# Patient Record
Sex: Female | Born: 1950 | Race: White | Hispanic: No | Marital: Married
Health system: Southern US, Community
[De-identification: ages and names within clinical notes are randomized; demographics above are authoritative.]

---

## 2004-03-10 ENCOUNTER — Ambulatory Visit: Payer: Self-pay | Admitting: Obstetrics and Gynecology

## 2005-05-03 ENCOUNTER — Ambulatory Visit: Payer: Self-pay | Admitting: Obstetrics and Gynecology

## 2005-05-21 ENCOUNTER — Ambulatory Visit: Payer: Self-pay | Admitting: Unknown Physician Specialty

## 2005-06-11 ENCOUNTER — Ambulatory Visit: Payer: Self-pay | Admitting: Unknown Physician Specialty

## 2005-10-14 ENCOUNTER — Ambulatory Visit: Payer: Self-pay | Admitting: Unknown Physician Specialty

## 2005-11-03 ENCOUNTER — Other Ambulatory Visit: Payer: Self-pay

## 2005-11-08 ENCOUNTER — Ambulatory Visit: Payer: Self-pay | Admitting: Unknown Physician Specialty

## 2005-12-16 ENCOUNTER — Ambulatory Visit: Payer: Self-pay | Admitting: Internal Medicine

## 2006-01-24 ENCOUNTER — Ambulatory Visit: Payer: Self-pay | Admitting: Internal Medicine

## 2006-01-29 ENCOUNTER — Ambulatory Visit: Payer: Self-pay | Admitting: Internal Medicine

## 2006-03-01 ENCOUNTER — Ambulatory Visit: Payer: Self-pay | Admitting: Internal Medicine

## 2006-04-01 ENCOUNTER — Ambulatory Visit: Payer: Self-pay | Admitting: Internal Medicine

## 2006-05-31 ENCOUNTER — Ambulatory Visit: Payer: Self-pay | Admitting: Internal Medicine

## 2006-06-01 ENCOUNTER — Ambulatory Visit: Payer: Self-pay | Admitting: Internal Medicine

## 2006-06-30 ENCOUNTER — Ambulatory Visit: Payer: Self-pay | Admitting: Internal Medicine

## 2007-02-10 ENCOUNTER — Inpatient Hospital Stay: Payer: Self-pay | Admitting: Internal Medicine

## 2007-05-08 ENCOUNTER — Ambulatory Visit: Payer: Self-pay | Admitting: Obstetrics and Gynecology

## 2008-10-29 ENCOUNTER — Ambulatory Visit: Payer: Self-pay

## 2010-10-12 ENCOUNTER — Ambulatory Visit: Payer: Self-pay | Admitting: Orthopedic Surgery

## 2010-10-22 ENCOUNTER — Inpatient Hospital Stay: Payer: Self-pay | Admitting: Orthopedic Surgery

## 2010-10-27 LAB — PATHOLOGY REPORT

## 2011-02-05 ENCOUNTER — Ambulatory Visit: Payer: Self-pay | Admitting: Orthopedic Surgery

## 2011-03-23 ENCOUNTER — Ambulatory Visit: Payer: Self-pay | Admitting: Orthopedic Surgery

## 2011-03-23 LAB — BASIC METABOLIC PANEL
Calcium, Total: 8.9 mg/dL (ref 8.5–10.1)
Co2: 27 mmol/L (ref 21–32)
Creatinine: 0.62 mg/dL (ref 0.60–1.30)
EGFR (African American): >60
Glucose: 81 mg/dL (ref 65–99)
Osmolality: 273 (ref 275–301)
Potassium: 3.7 mmol/L (ref 3.5–5.1)
Sodium: 138 mmol/L (ref 136–145)

## 2011-03-23 LAB — SEDIMENTATION RATE: Erythrocyte Sed Rate: 16 mm/hr (ref 0–30)

## 2011-03-23 LAB — APTT: Activated PTT: 34.4 secs (ref 23.6–35.9)

## 2011-03-23 LAB — CBC
MCH: 25.9 pg — ABNORMAL LOW (ref 26.0–34.0)
Platelet: 319 10*3/uL (ref 150–440)
RBC: 4.78 10*6/uL (ref 3.80–5.20)
WBC: 8.9 10*3/uL (ref 3.6–11.0)

## 2011-03-23 LAB — MRSA PCR SCREENING

## 2011-03-30 ENCOUNTER — Inpatient Hospital Stay: Payer: Self-pay | Admitting: Orthopedic Surgery

## 2011-03-31 LAB — PLATELET COUNT: Platelet: 231 10*3/uL (ref 150–440)

## 2011-03-31 LAB — BASIC METABOLIC PANEL
Anion Gap: 9 (ref 7–16)
BUN: 12 mg/dL (ref 7–18)
Calcium, Total: 7.7 mg/dL — ABNORMAL LOW (ref 8.5–10.1)
Chloride: 99 mmol/L (ref 98–107)
Co2: 28 mmol/L (ref 21–32)
Creatinine: 0.96 mg/dL (ref 0.60–1.30)
Potassium: 4.1 mmol/L (ref 3.5–5.1)

## 2011-04-01 LAB — HEMOGLOBIN: HGB: 9.4 g/dL — ABNORMAL LOW (ref 12.0–16.0)

## 2011-04-02 LAB — PATHOLOGY REPORT

## 2011-06-24 ENCOUNTER — Ambulatory Visit: Payer: Self-pay | Admitting: Internal Medicine

## 2011-07-07 ENCOUNTER — Ambulatory Visit: Payer: Self-pay | Admitting: Orthopedic Surgery

## 2011-07-15 ENCOUNTER — Ambulatory Visit: Payer: Self-pay | Admitting: Unknown Physician Specialty

## 2011-07-22 LAB — PATHOLOGY REPORT

## 2011-08-16 ENCOUNTER — Ambulatory Visit: Payer: Self-pay | Admitting: Orthopedic Surgery

## 2011-08-16 LAB — CBC
HCT: 37.6 % (ref 35.0–47.0)
MCH: 26.9 pg (ref 26.0–34.0)
MCV: 83 fL (ref 80–100)
Platelet: 279 10*3/uL (ref 150–440)
RDW: 14 % (ref 11.5–14.5)
WBC: 6.7 10*3/uL (ref 3.6–11.0)

## 2011-08-16 LAB — BASIC METABOLIC PANEL
Anion Gap: 10 (ref 7–16)
Calcium, Total: 8.6 mg/dL (ref 8.5–10.1)
Creatinine: 0.61 mg/dL (ref 0.60–1.30)
EGFR (Non-African Amer.): >60
Glucose: 90 mg/dL (ref 65–99)
Osmolality: 276 (ref 275–301)
Potassium: 3.7 mmol/L (ref 3.5–5.1)

## 2011-08-16 LAB — SEDIMENTATION RATE: Erythrocyte Sed Rate: 17 mm/hr (ref 0–30)

## 2011-08-26 ENCOUNTER — Inpatient Hospital Stay: Payer: Self-pay | Admitting: Orthopedic Surgery

## 2011-08-27 LAB — HEMOGLOBIN: HGB: 11.1 g/dL — ABNORMAL LOW (ref 12.0–16.0)

## 2011-08-27 LAB — BASIC METABOLIC PANEL
BUN: 13 mg/dL (ref 7–18)
Calcium, Total: 7.9 mg/dL — ABNORMAL LOW (ref 8.5–10.1)
Co2: 27 mmol/L (ref 21–32)
Creatinine: 0.71 mg/dL (ref 0.60–1.30)
EGFR (African American): >60
Glucose: 100 mg/dL — ABNORMAL HIGH (ref 65–99)
Sodium: 130 mmol/L — ABNORMAL LOW (ref 136–145)

## 2011-08-27 LAB — PLATELET COUNT: Platelet: 296 10*3/uL (ref 150–440)

## 2011-08-28 LAB — OSMOLALITY, URINE: Osmolality: 243 mOsm/kg

## 2011-08-28 LAB — HEMOGLOBIN: HGB: 10.8 g/dL — ABNORMAL LOW (ref 12.0–16.0)

## 2011-08-28 LAB — BASIC METABOLIC PANEL
Calcium, Total: 8 mg/dL — ABNORMAL LOW (ref 8.5–10.1)
Chloride: 93 mmol/L — ABNORMAL LOW (ref 98–107)
Creatinine: 0.5 mg/dL — ABNORMAL LOW (ref 0.60–1.30)
EGFR (African American): >60
Glucose: 102 mg/dL — ABNORMAL HIGH (ref 65–99)
Sodium: 128 mmol/L — ABNORMAL LOW (ref 136–145)

## 2011-08-28 LAB — SODIUM, URINE, RANDOM: Sodium, Urine Random: 54 mmol/L (ref 20–110)

## 2011-08-29 LAB — BASIC METABOLIC PANEL
Anion Gap: 8 (ref 7–16)
Co2: 29 mmol/L (ref 21–32)
Creatinine: 0.51 mg/dL — ABNORMAL LOW (ref 0.60–1.30)
EGFR (Non-African Amer.): >60
Glucose: 102 mg/dL — ABNORMAL HIGH (ref 65–99)
Potassium: 3.7 mmol/L (ref 3.5–5.1)
Sodium: 132 mmol/L — ABNORMAL LOW (ref 136–145)

## 2011-09-13 ENCOUNTER — Emergency Department: Payer: Self-pay | Admitting: Emergency Medicine

## 2012-07-15 IMAGING — CR DG CHEST 2V
1 series · 3 of 3 positions shown · non-contrast
Comparison: none

REASON FOR EXAM: htn
COMMENTS:

PROCEDURE:     DXR - DXR CHEST PA (OR AP) AND LATERAL  - October 12, 2010  [DATE]
RESULT:     Comparison: None.

[Series 1: view not recorded · 0.17mm/px · 3 of 3 slices shown]
[im 1/3]
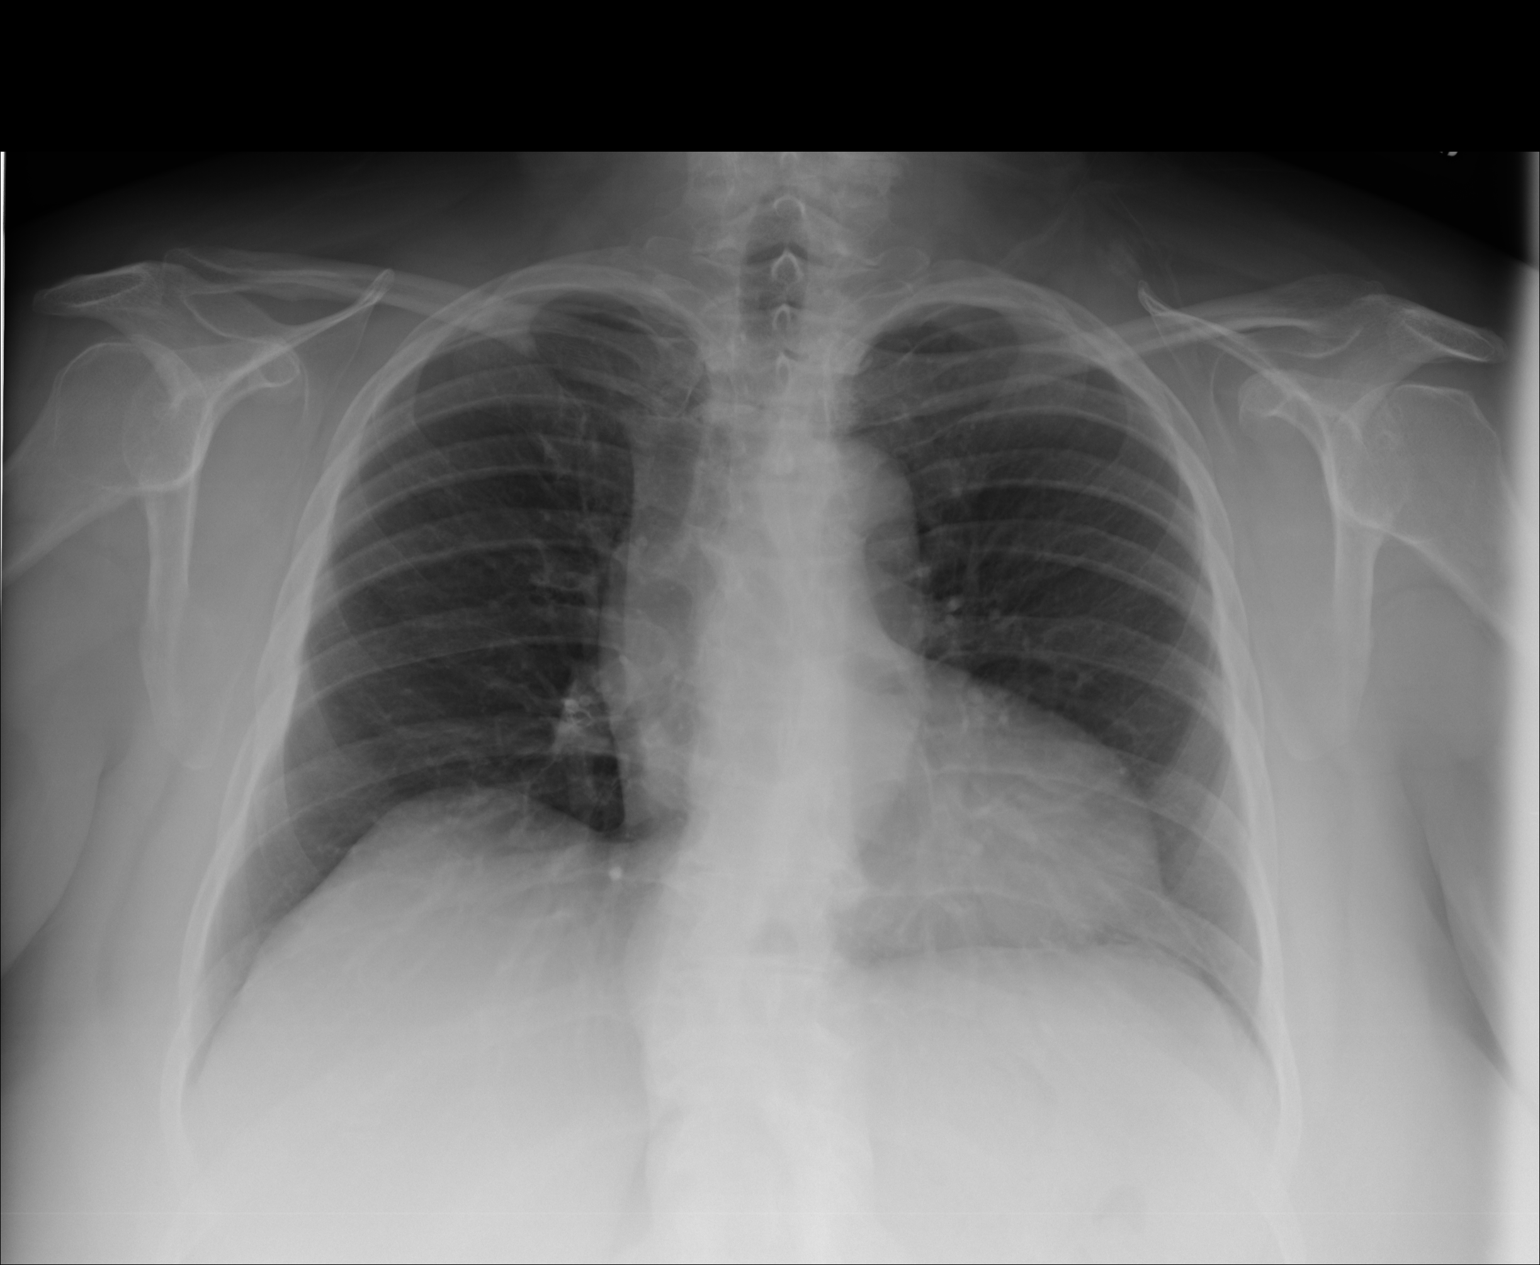
[im 2/3]
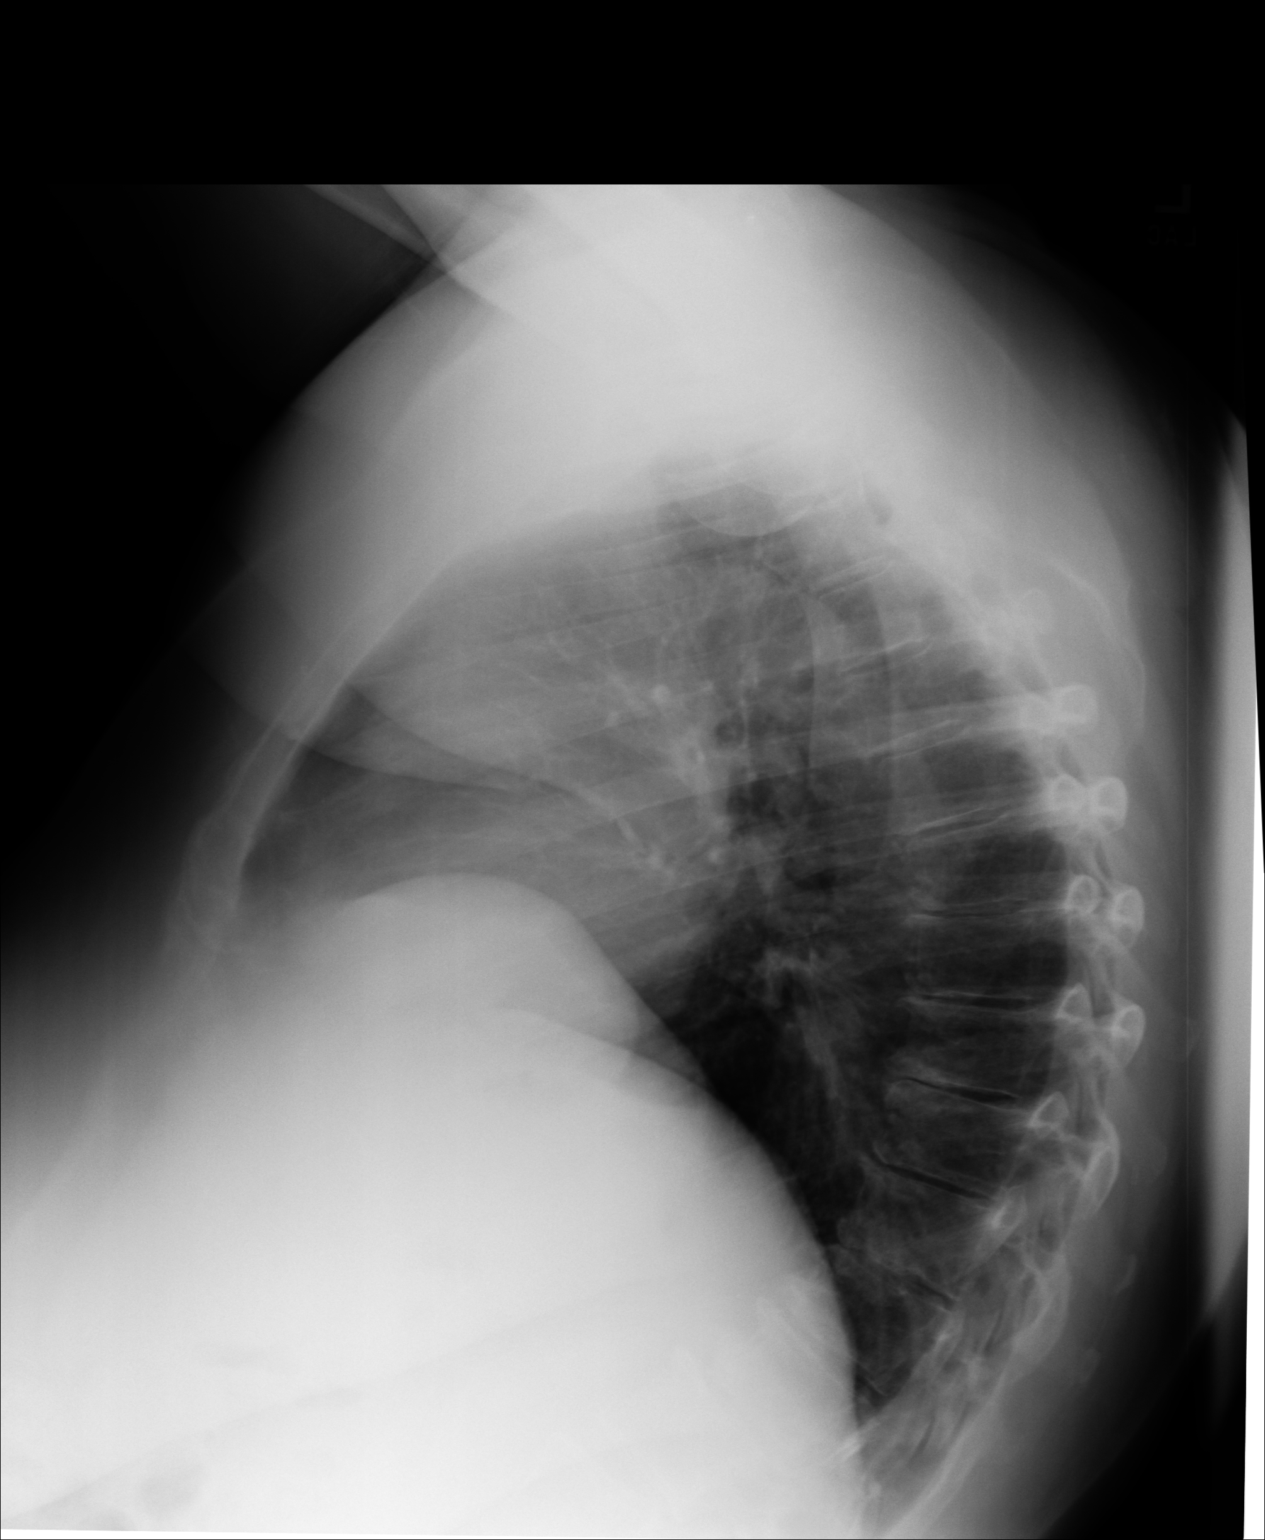
[im 3/3]
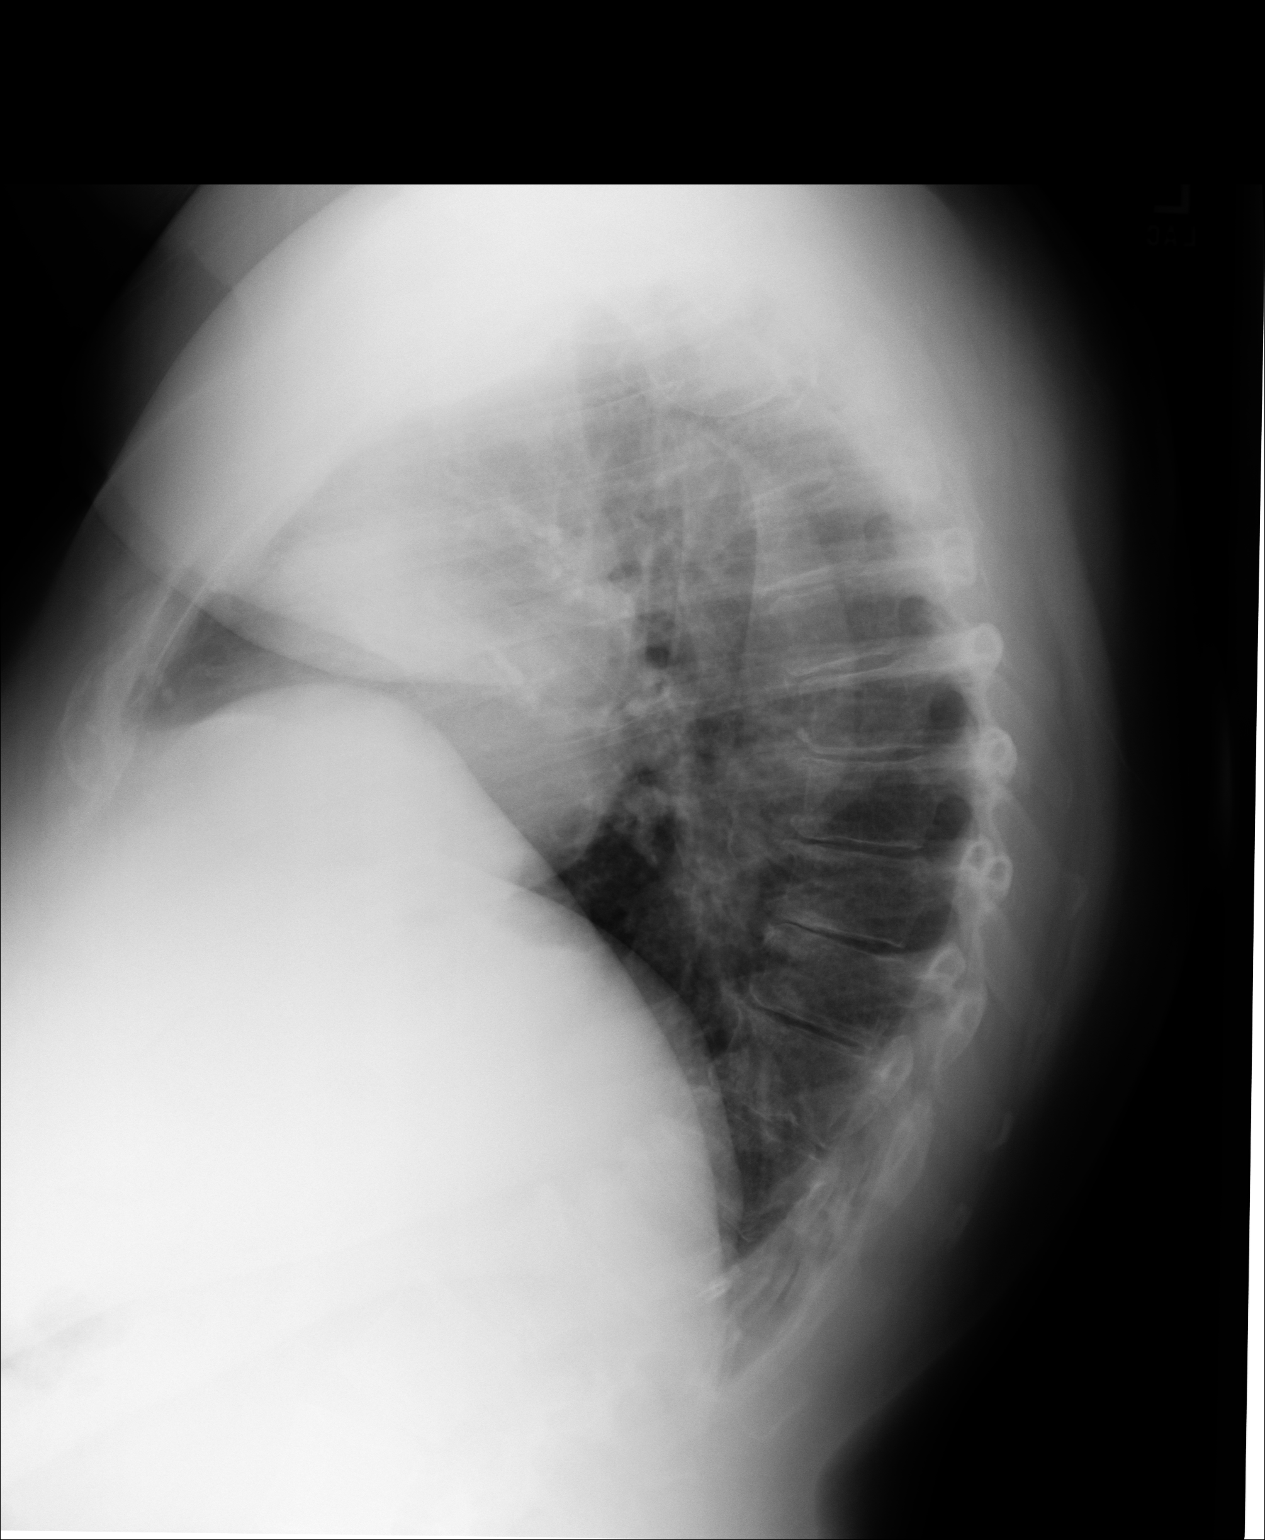

[3 of 3 positions shown; findings below may reference images not displayed]

FINDINGS: The lung volumes are low. Heart size upper limits normal. The aorta is
mildly tortuous. No focal pulmonary opacities.
IMPRESSION: No acute cardiopulmonary disease.

## 2012-07-25 IMAGING — CR RIGHT HIP - COMPLETE 2+ VIEW
1 series · 1 of 1 positions shown · non-contrast
Comparison: none

REASON FOR EXAM: s/p THA
COMMENTS:   Bedside (portable):Y

PROCEDURE:     DXR - DXR HIP RIGHT COMPLETE  - October 22, 2010  [DATE]
RESULT:     Portable AP and lateral views of the right hip are obtained. The
patient is status post right hip placement. No fracture about the prosthetic
components is seen. There is no dislocation at the prosthetic hip joint.

[view not recorded]
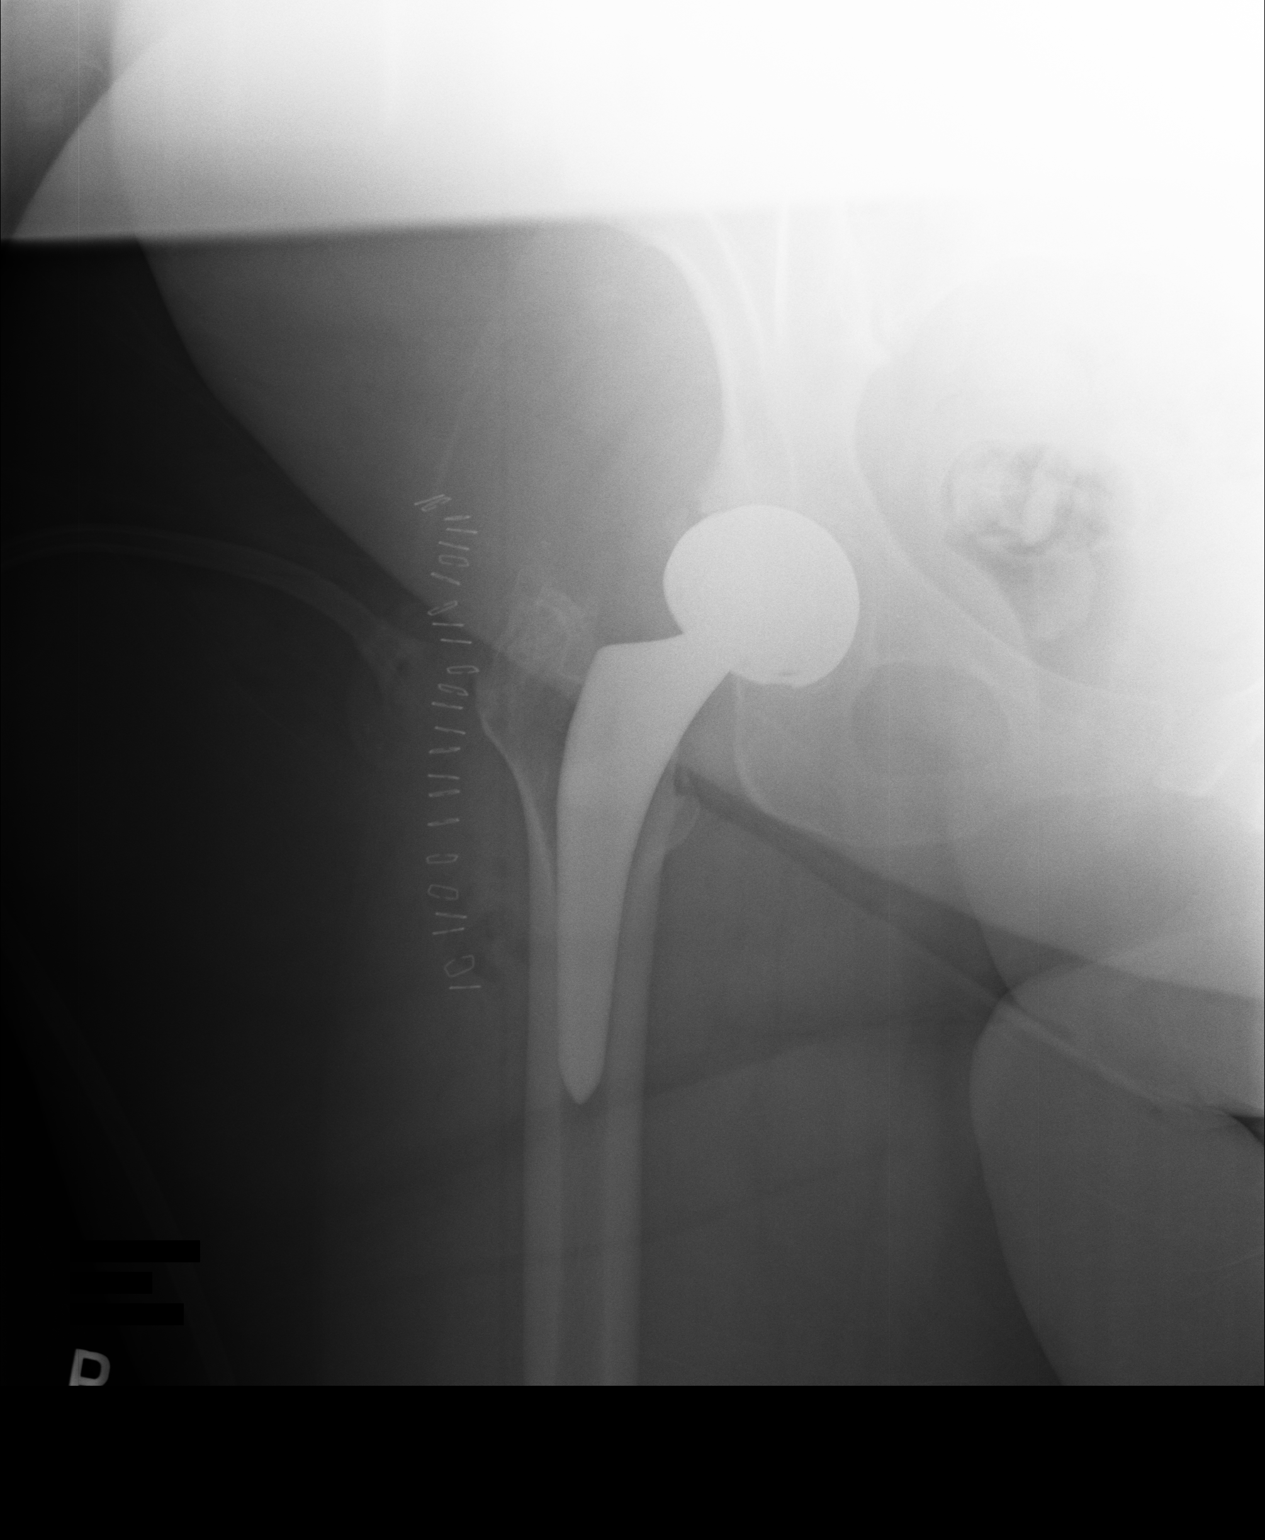

[1 of 1 positions shown; findings below may reference images not displayed]

IMPRESSION: The patient is status post right hip replacement. No
abnormal post operative changes are identified.

## 2012-12-31 IMAGING — CR DG KNEE 1-2V*L*
1 series · 2 of 2 positions shown · non-contrast
Comparison: none

REASON FOR EXAM: postop
COMMENTS:   Bedside (portable):Y

PROCEDURE:     DXR - DXR KNEE LEFT AP AND LATERAL  - March 30, 2011 [DATE]
RESULT:     Comparison: None there

[Series 1: ap · 0.17mm/px · 2 of 2 slices shown]
[im 1/2]
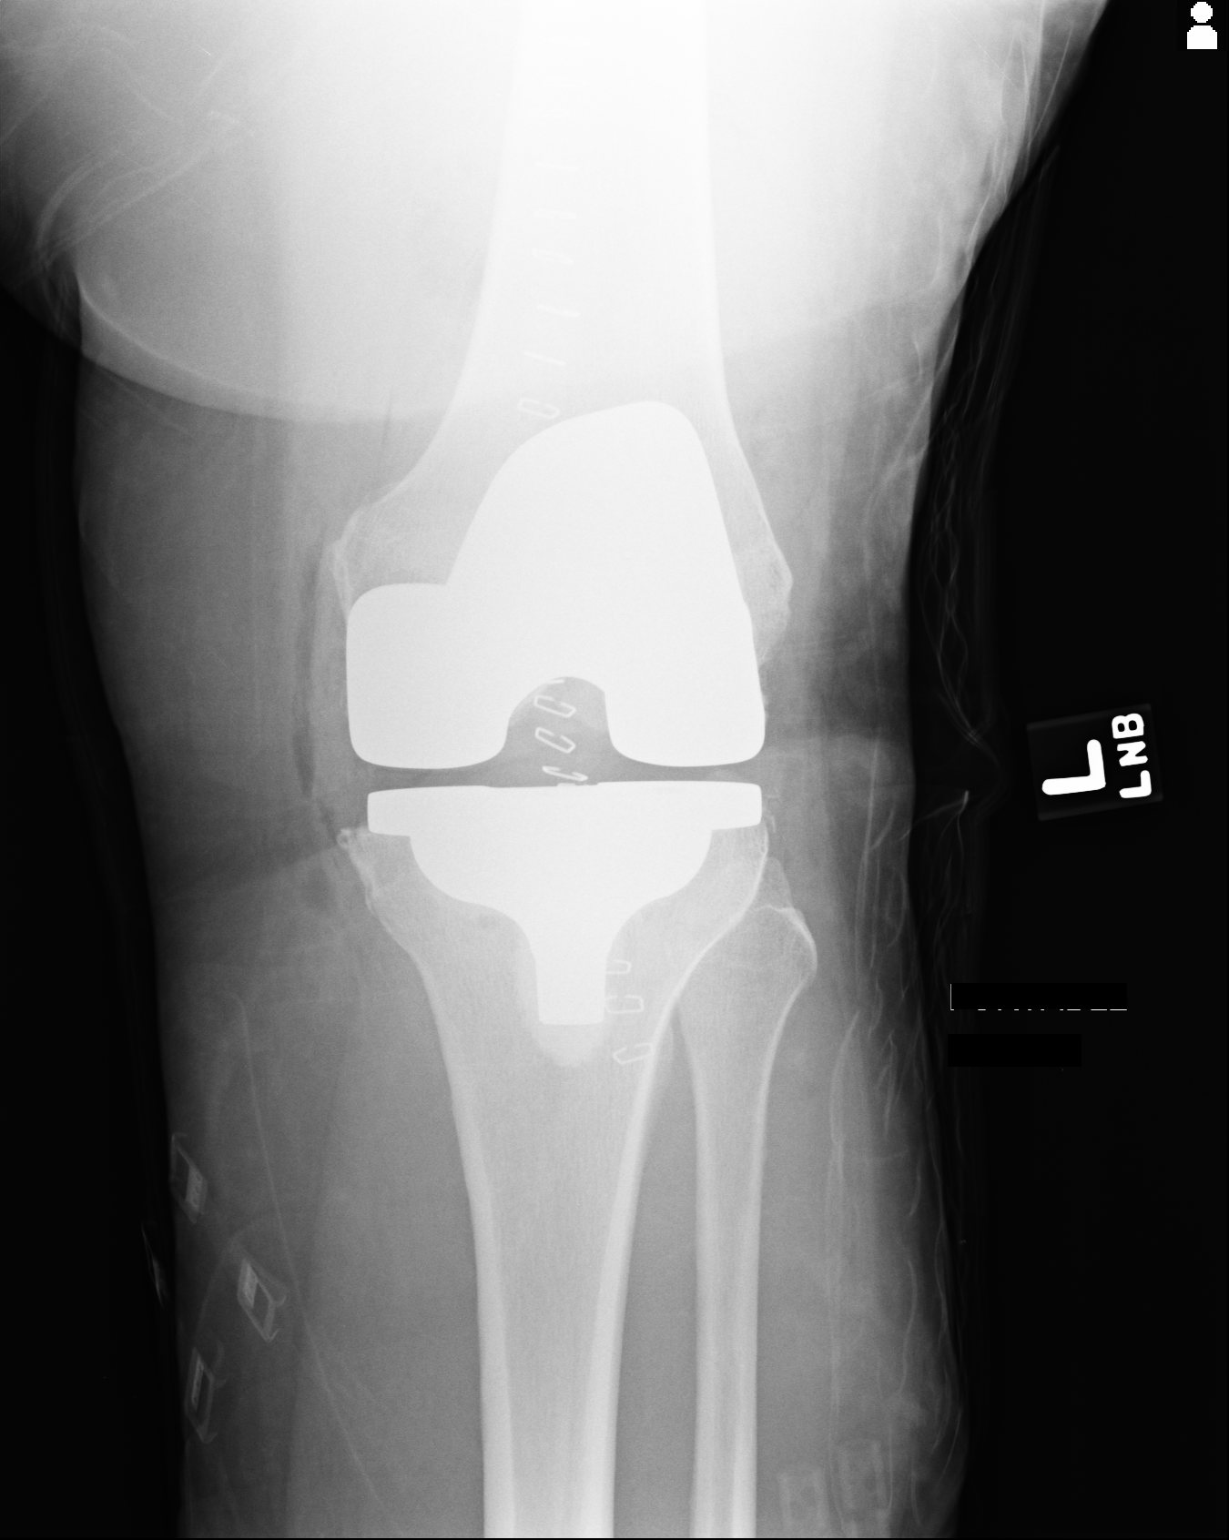
[im 2/2]
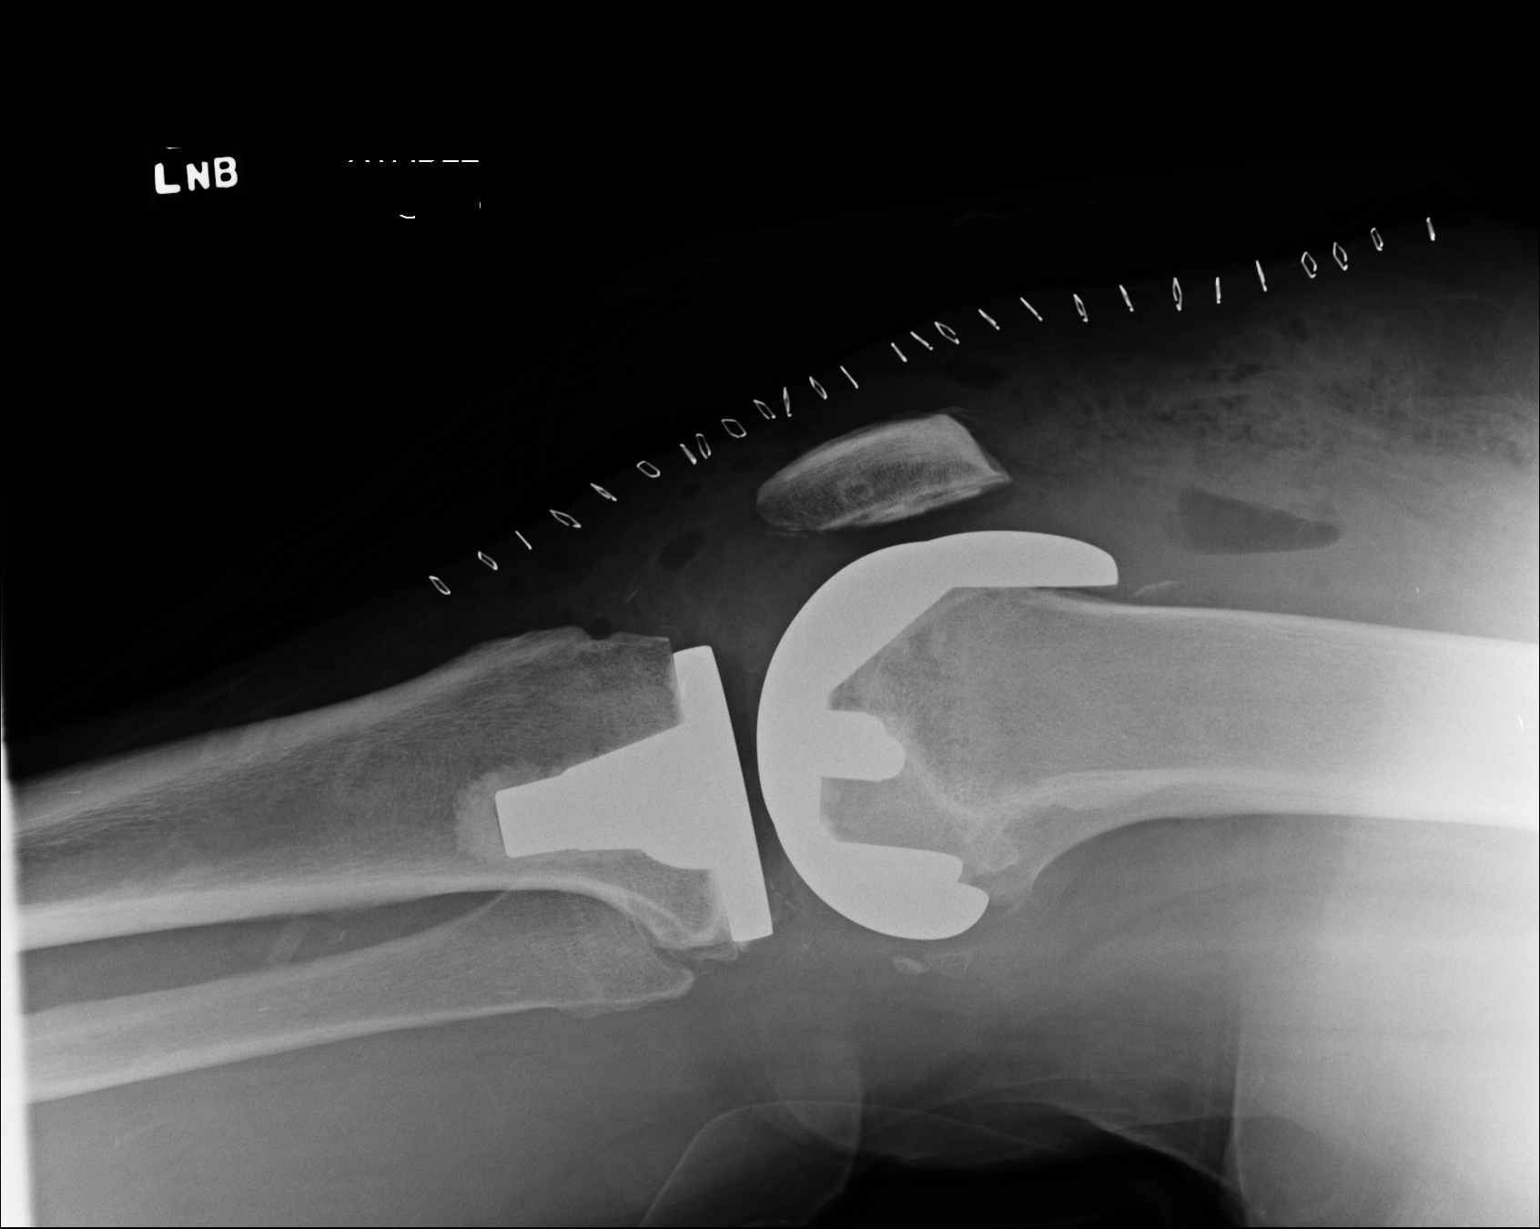

[2 of 2 positions shown; findings below may reference images not displayed]

FINDINGS: Hardware is seen from left total knee arthroplasty. Soft tissue gas is
likely postoperative. Skin staples are present. There is a small calcific
crescentic density just superior to the anterior aspect of the femoral
hardware, possibly within the joint.
IMPRESSION: Left total knee arthroplasty.

## 2013-02-13 ENCOUNTER — Ambulatory Visit: Payer: Self-pay | Admitting: Internal Medicine

## 2013-05-29 IMAGING — CR DG KNEE 1-2V*R*
1 series · 2 of 2 positions shown · non-contrast
Comparison: none

REASON FOR EXAM: right tkr
COMMENTS:

PROCEDURE:     DXR - DXR KNEE RIGHT AP AND LATERAL  - August 26, 2011 [DATE]
RESULT:     Comparison: None.

[Series 1: ap · 0.17mm/px · 2 of 2 slices shown]
[im 1/2]
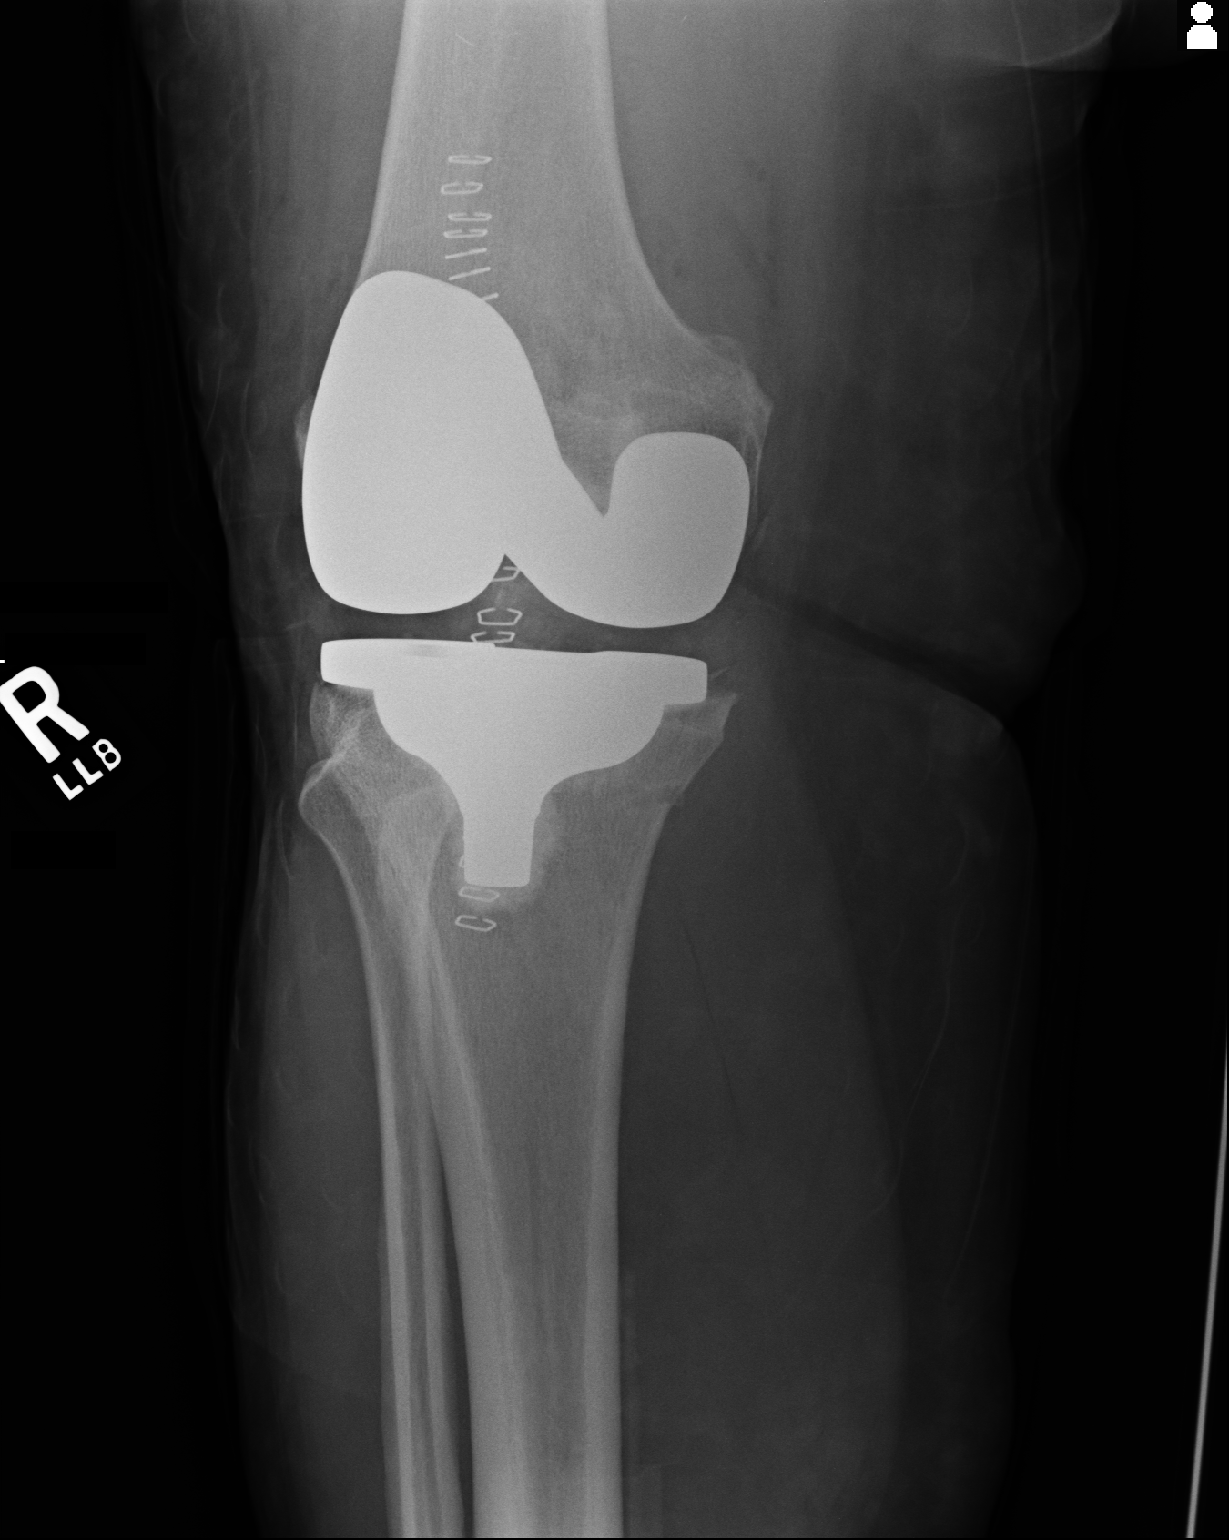
[im 2/2]
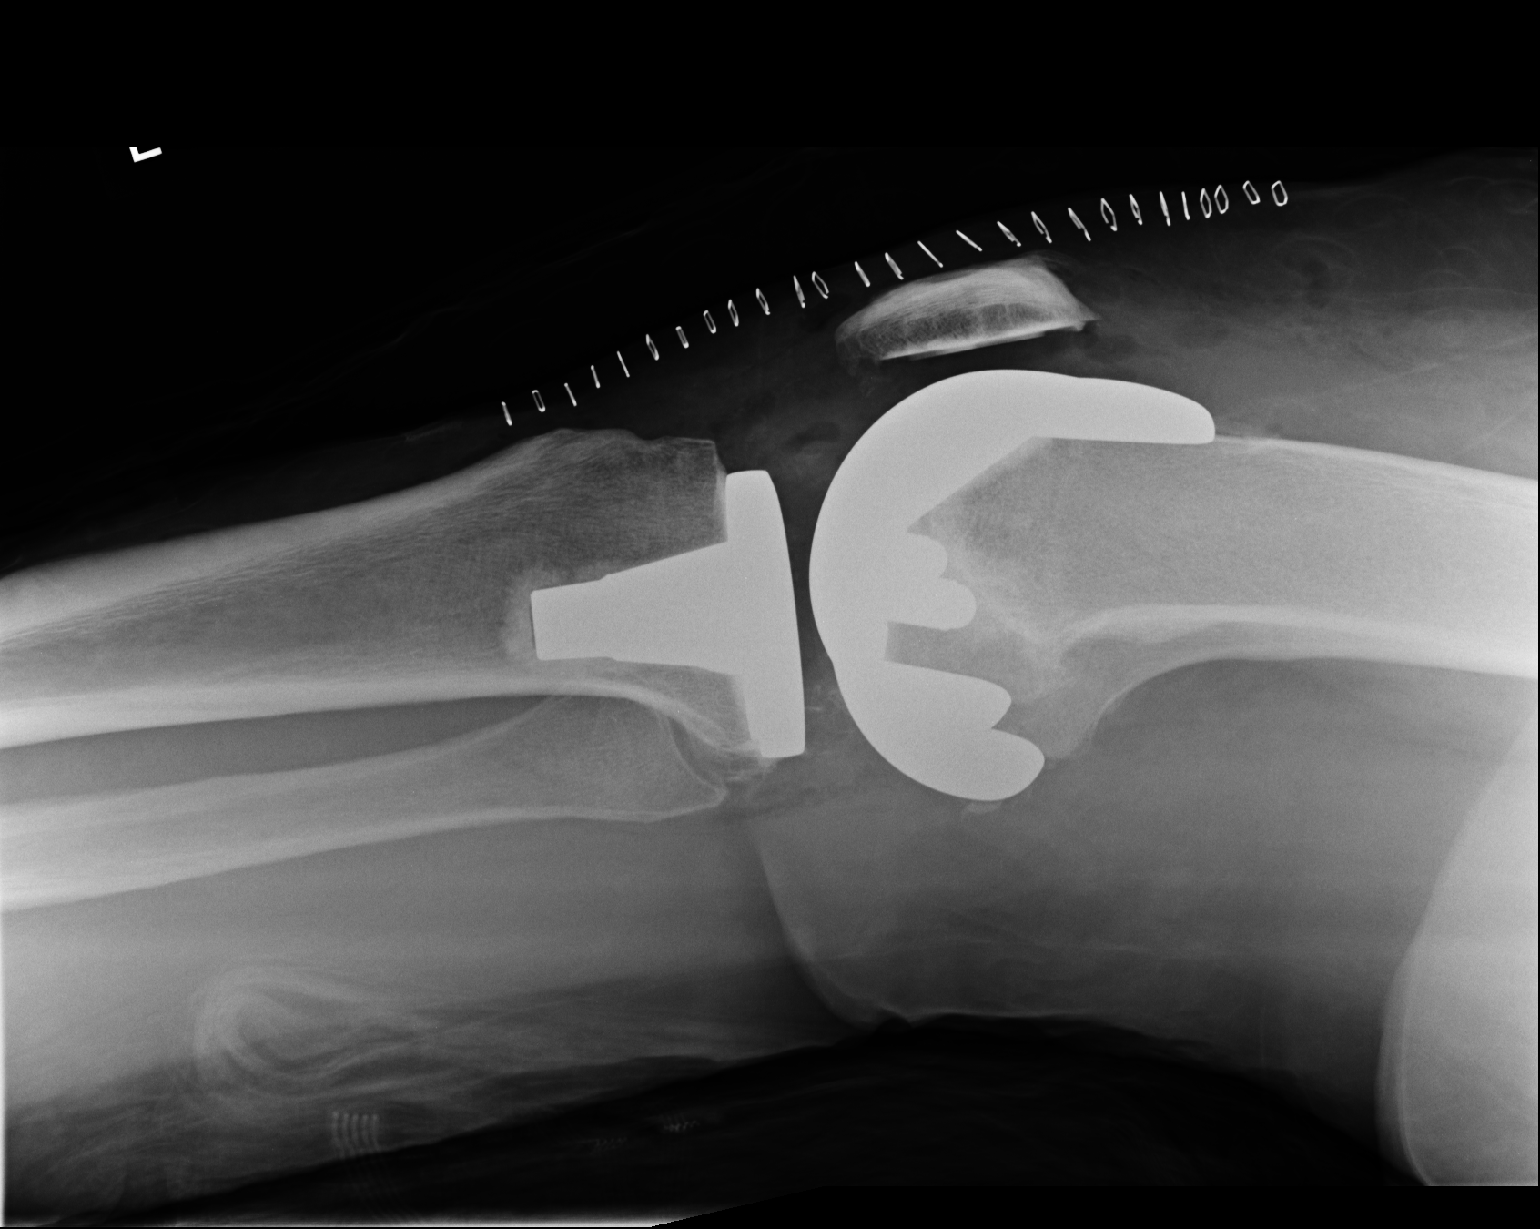

[2 of 2 positions shown; findings below may reference images not displayed]

FINDINGS: Hardware seen from a right total knee arthroplasty. Small lucency along the
medial tibial metaphysis is likely postoperative. Skin staples are present.
Soft tissue gas is likely postoperative.
IMPRESSION: Right total knee arthroplasty.

## 2013-06-16 IMAGING — CR DG KNEE COMPLETE 4+V*R*
1 series · 4 of 4 positions shown · non-contrast
Comparison: none

REASON FOR EXAM: paost fall..pt is in [place]
COMMENTS:   LMP: Post-Menopausal

PROCEDURE:     DXR - DXR KNEE RT COMP WITH OBLIQUES  - September 13, 2011  [DATE]
RESULT:     Comparison: 08/26/2011

[Series 1: t knee ap right · 0.14mm/px · 4 of 4 slices shown]
[im 1/4]
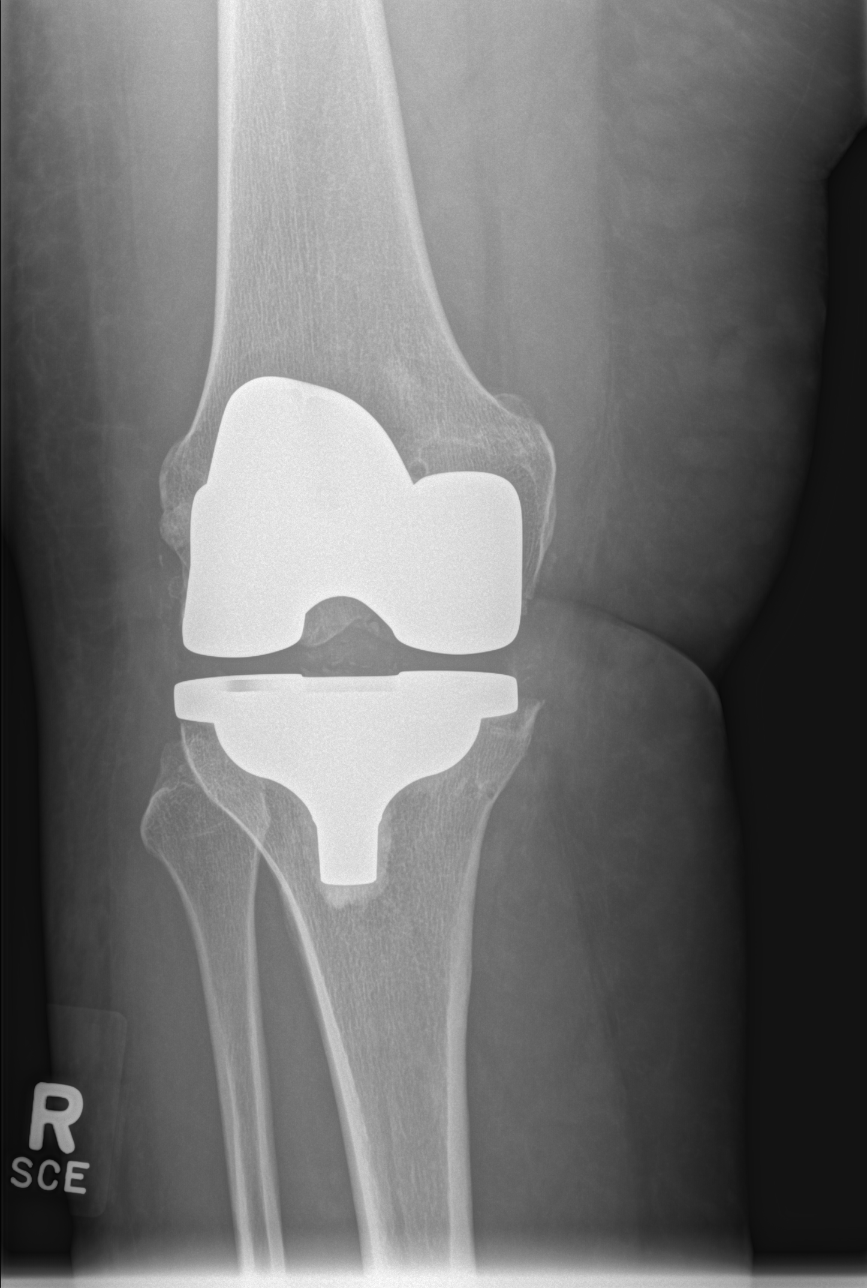
[im 2/4]
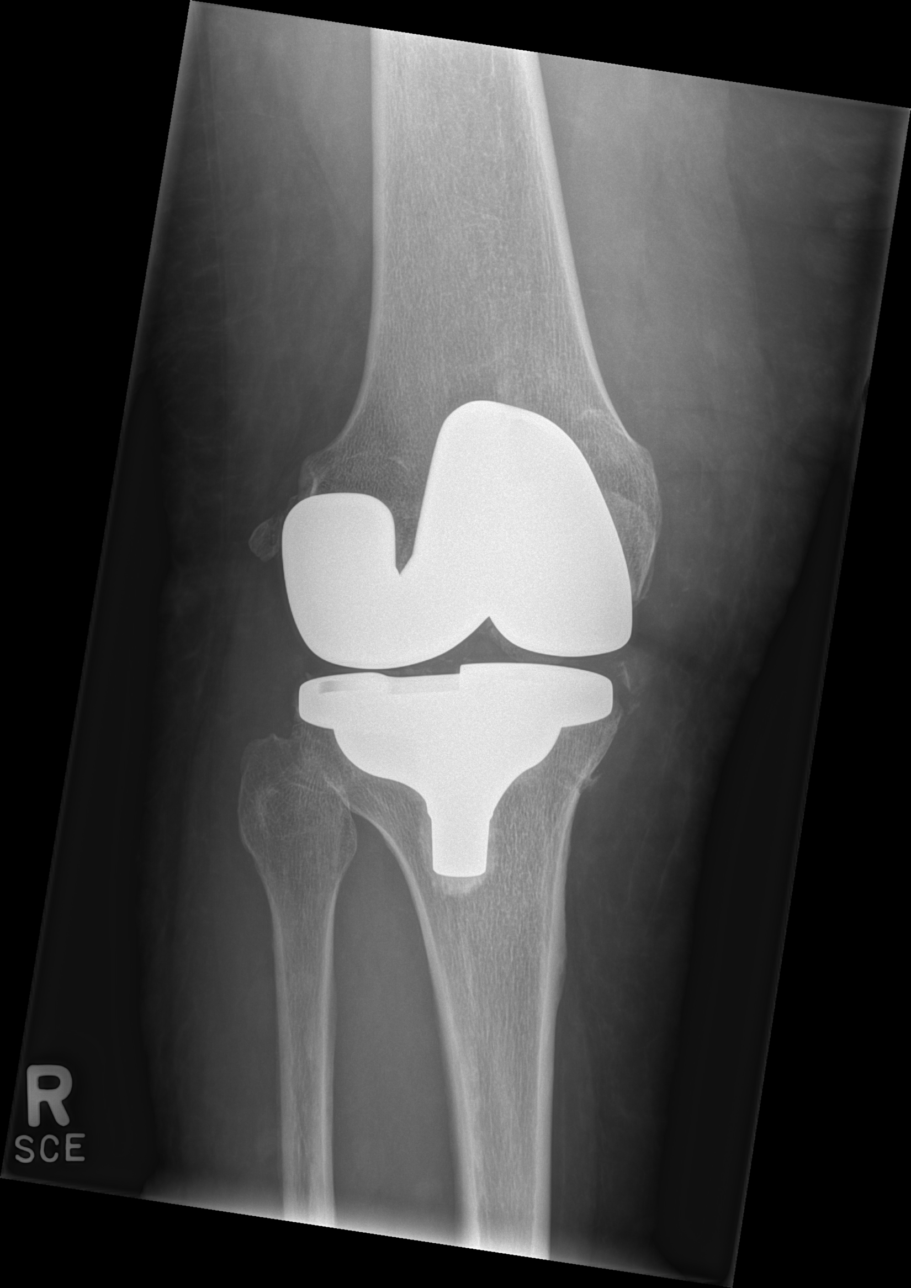
[im 3/4]
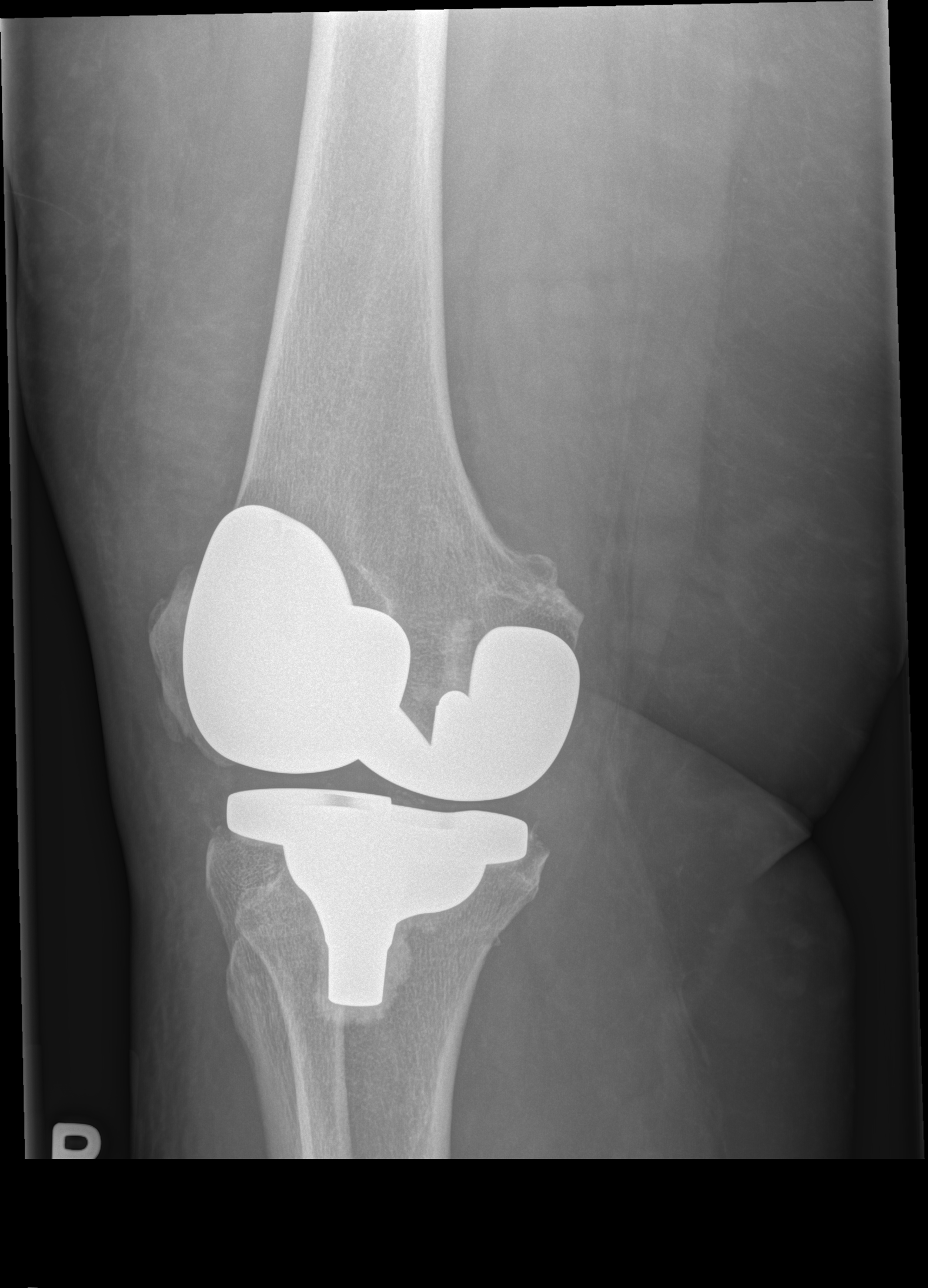
[im 4/4]
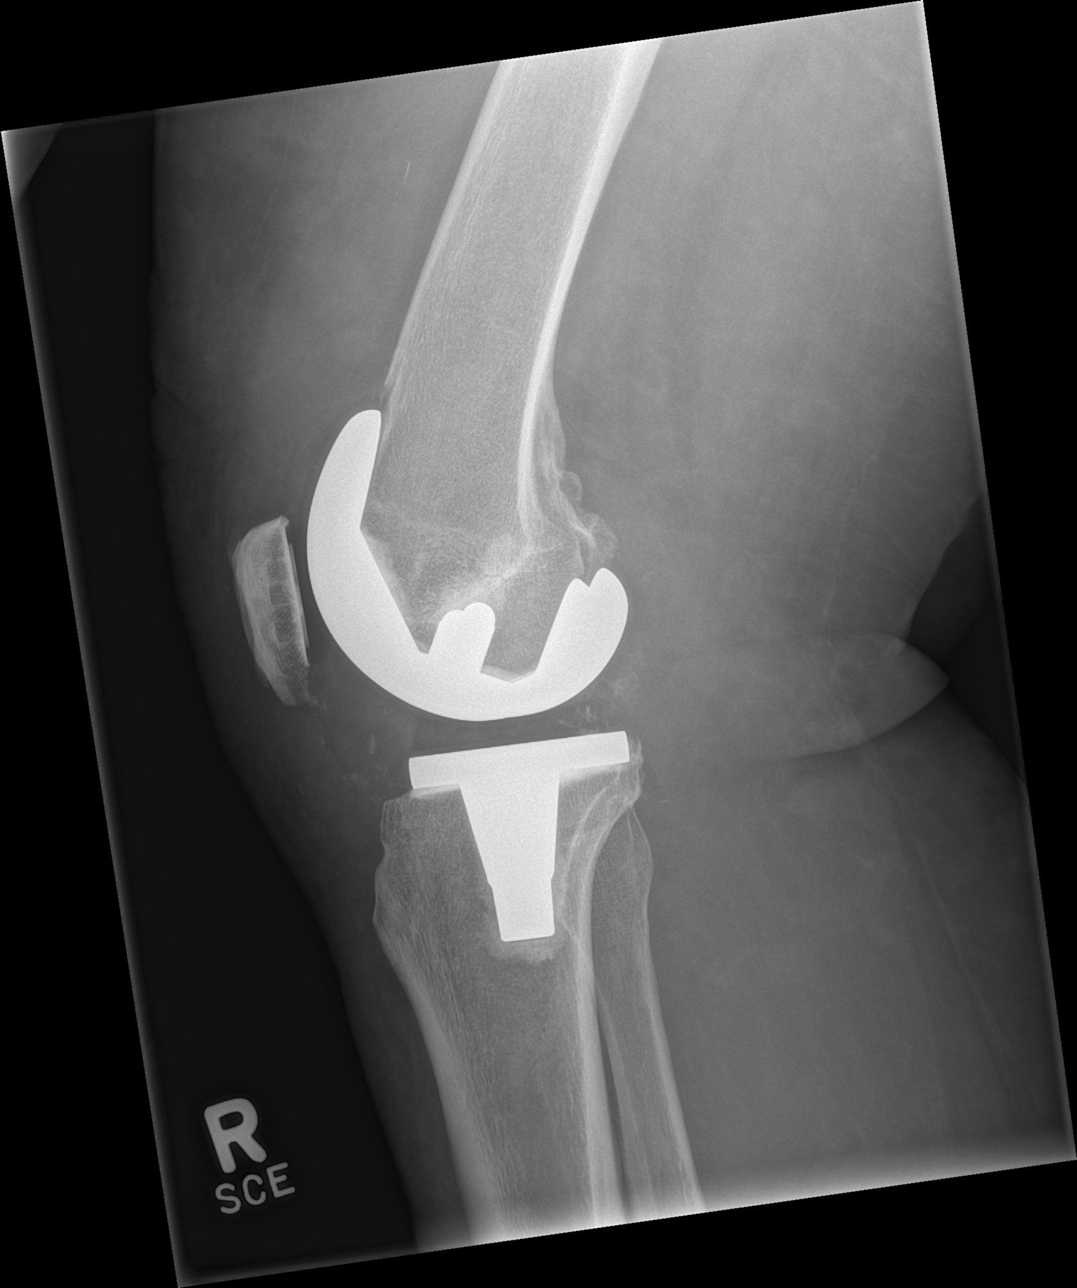

[4 of 4 positions shown; findings below may reference images not displayed]

FINDINGS: Hardware seen from right total knee arthroplasty. Lucency about the medial
femoral component is similar to the recent postoperative radiograph. Subtle
lucency in the medial tibia plateau is similar to the postoperative
radiograph and likely related to postoperative changes. No acute fracture
seen.
IMPRESSION: No acute fracture.

[REDACTED]

## 2014-03-28 ENCOUNTER — Ambulatory Visit: Payer: Self-pay | Admitting: Internal Medicine

## 2014-06-23 NOTE — Op Note (Signed)
PATIENT NAME:  Dominique Dean, Dominique Dean MR#:  [id number] DATE OF BIRTH:  [birth year]  DATE OF PROCEDURE:  08/26/2011  PREOPERATIVE DIAGNOSIS: Right knee osteoarthritis.   POSTOPERATIVE DIAGNOSIS: Right knee osteoarthritis.   PROCEDURE: Right total knee replacement.   ANESTHESIA: Spinal.   SURGEON: Leitha Schuller, MD  ASSISTANT: Cranston Neighbor, PA-C.   DESCRIPTION OF PROCEDURE: Patient brought to the Operating Room. After adequate spinal anesthesia was obtained a bump was placed underneath the right buttock to internally rotate the leg. The leg was then prepped and draped in the usual sterile fashion with a tourniquet applied to the upper thigh and Alvarado legholder utilized. After appropriate patient identification and timeout procedures were completed, the leg was exsanguinated with an Esmarch and the tourniquet raised to 300 mmHg. An anterior skin incision was made followed by medial parapatellar arthrotomy. Inspection revealed significant lateral and patellofemoral degenerative change, moderate medial compartment degenerative change. The anterior cruciate ligament was excised along with the infrapatellar fat pad and the proximal tibia exposed for application of the Medacta proximal patient specific cutting guide. After this was applied and alignment checked, pins were placed and the proximal tibia cut carried out with bone removed matching the tibial template. Next, the femur was approached in a similar fashion using the femoral patient specific cutting block with two drill holes distally for rotation of the subsequent 4-in-1 cutting block and distal drill holes made to stabilize it for the distal femur cut. After the distal femoral cut was completed, the pins were placed in the distal holes and the #4 cutting block was applied. Anterior, posterior, and chamfer cuts made without notching. The residual meniscus was removed at this time as well as some posterior bone and some loose bodies. The tibial trial  was placed in the appropriate rotation based on one of the pins left from the tibial cutting block and proximal tibial drill hole made followed by the V-notch that would hold the tibial component in position. A 10 mm insert was placed then the femoral component was then placed. The 10 mm component gave full extension with some laxity in extension as well as mid flexion and a 12 mm component was placed, this gave good stability throughout a range of motion and was subsequently used as the component. The notch cut was made in the distal femur along with drill holes. The patella was approached with the patellar cutting guide. After cutting this it sized to a size 2 and with the trial the patella tracked well with no touch technique. At this point trial components were all removed and the knee injected with local anesthetic and morphine in the posterior capsule as well as the arthrotomy. The bony surfaces were thoroughly irrigated and dried. The tibial component was cemented into place first with excess cement removed. Tibial insert placed followed by the femoral component with the knee held in extension. The patellar button was clamped into place. After the cement had set, excess cement was removed. Again patella tracking was normal and there is good stability in extension and mid flexion. The tourniquet was let down and hemostasis checked with electrocautery. The arthrotomy was repaired using a heavy quill suture followed by 2-0 quill subcutaneously and skin staples. Xeroform, 4 x 4's, ABD, Webril, Polar Care, and Ace wrap applied followed by a knee immobilizer. There were no complications.   SPECIMEN: Cut ends of bone.   ESTIMATED BLOOD LOSS: 200 mL.   TOURNIQUET TIME: 69 minutes at 300 mmHg.   IMPLANTS: Medacta  GMK primary implants size right 4N standard femoral component, size 2 patellar component, a right fixed 3 tibial tray and a size 3 12-mm standard tibial insert. All components cemented.   CONDITION:  To recovery room stable.  ____________________________ Leitha SchullerMichael J. Tyrique Sporn, MD mjm:cms D: 08/26/2011 21:15:30 ET T: 08/27/2011 09:43:14 ET JOB#: [id number]  cc: Leitha SchullerMichael J. Alejandra Hunt, MD, <Dictator> Leitha SchullerMICHAEL J Irean Kendricks MD ELECTRONICALLY SIGNED 08/27/2011 12:48

## 2014-06-23 NOTE — Op Note (Signed)
PATIENT NAME:  Dominique Dean, Dominique Dean MR#:  [id number] DATE OF BIRTH:  Aug 10, 1950  DATE OF PROCEDURE:  03/30/2011  PREOPERATIVE DIAGNOSIS: Left knee osteoarthritis.   POSTOPERATIVE DIAGNOSIS: Left knee osteoarthritis.   PROCEDURE: Left total knee replacement.   SURGEON: Leitha Schuller, M.D.   ASSISTANT: Thompson Grayer, PA-C  ANESTHESIA: General.  DESCRIPTION OF PROCEDURE: The patient was brought to the Operating Room and after adequate anesthesia was obtained the left leg was prepped and draped in the usual sterile fashion with a tourniquet applied to the upper thigh. After prepping and draping in the usual sterile manner, appropriate patient identification and time out procedures carried out, the leg was exsanguinated with an Esmarch and the tourniquet raised to 300 mmHg. A midline skin incision was made followed by a medial parapatellar arthrotomy. Exposure of the joint revealed exposed bone in all three compartments of the knee and the anterior cruciate ligament intact. The patella was retracted laterally and the infrapatellar fat pad excised, the anterior medial capsule elevated. Retractors were placed and the anterior horns of the menisci removed. The tibial cutting guide from the Medacta preop templating system was applied to the anterior tibia at the appropriate level and the alignment rod checked. Pins were placed anteriorly and proximal resection carried out. After this proximal resection and removal of the bone, the femur was addressed using a cutting guide. The distal cut was carried out with two distal drill holes for rotation of the planned implant. The anterior, posterior, and chamfer cuts were carried out using the #4 block and there was no notching of the femur. At this point, the anterior cruciate ligament was excised and the residual posterior horns of the menisci excised. A #3 tibia was placed in the appropriate location with one of the prior anterior pins left in place on the tibia  for external rotation guide. Proximal drilling was carried out and then the proximal tibial V-cut placed, the 10 mm trial placed, followed by the femoral trial, and the knee was placed through range of motion with good stability. At this point, the distal femoral drill holes were made along with the cut through the trochlea using the slotted guide. At this point, all trial components were removed. The patella was cut using the patellar guide, three drill holes made and sized to a size 2. At this point, the knee was thoroughly irrigated and the knee injected with medication for postoperative analgesia, in the posterior capsule as well as along the arthrotomy. A combination of 30 mL of 0.25% Sensorcaine with epinephrine, 30 mg Toradol, and 10 mg morphine and saline to 60 mL was infiltrated. Following this the bony surfaces were thoroughly irrigated and dried and the components cemented in place. The tibial component was cemented into place first followed by the 10 mm tibial insert of the femoral component, excess cement removed and the knee held in extension. The patellar button was clamped into place, after the cement had set. There was no excess cement; this was removed. Again, the patella tracked well with no-touch technique. The knee was again thoroughly irrigated and the arthrotomy closed using a running quill suture, 2 - 0 quill subcutaneously, and skin staples. Xeroform, 4 x 4's, Webril, ABD, Polar Care and Ace wrap applied. The patient was sent to the Recovery Room in stable condition.   ESTIMATED BLOOD LOSS: 50 mL.  TOURNIQUET TIME: 85 minutes.   IMPLANTS: Medacta GMK primary femoral component, left, #4 standard, #3 left tibial tray and a size 3  10-mm standard tibial insert, and a size 2 patellar component. All components were cemented with standard cement.  CONDITION: To the Recovery Room stable.   SPECIMENS: Cut ends of bone. ____________________________ Leitha SchullerMichael J. Corneisha Alvi,  MD mjm:slb D: 03/30/2011 22:05:35 ET T: 03/31/2011 08:54:35 ET JOB#: [id number]  cc: Leitha SchullerMichael J. Teva Bronkema, MD, <Dictator> Leitha SchullerMICHAEL J Albion Weatherholtz MD ELECTRONICALLY SIGNED 03/31/2011 17:26

## 2014-06-23 NOTE — Discharge Summary (Signed)
PATIENT NAME:  Dominique Dean, Dominique Dean MR#:  [id number] DATE OF BIRTH:  Jul 25, 1950  DATE OF ADMISSION:  03/30/2011 DATE OF DISCHARGE:  04/02/2011  ADMITTING DIAGNOSIS: Status post left total knee replacement for degenerative arthritis.   DISCHARGE DIAGNOSES:  1. Status post left total knee replacement for degenerative arthritis.  2. Tachycardia. 3. Acute blood loss anemia.   ATTENDING: Kennedy Bucker, M.D. [facility])  PROCEDURES: On 03/30/2011 the patient underwent left total knee arthroplasty by Dr. Rosita Kea with assistant Thompson Grayer, PA-C.   ANESTHESIA: General.   ESTIMATED BLOOD LOSS: 50 mL.  TOURNIQUET TIME: 85 minutes.   SPECIMENS SENT: Cut ends of bone.   OPERATIVE FINDINGS: Severe medial, lateral and patellofemoral degenerative joint disease.   IMPLANTS: By Jacelyn Pi, My-Knee.  DRAINS: No drains were placed.   COMPLICATIONS: No complications occurred.   HISTORY: The patient is a 64 year old who had prior right total hip replacement and did well with that. She also has severe osteoarthritis of the left knee that has become more symptomatic as her hip pain has resolved. She has had extensive treatment including antiinflammatories, oral narcotics, and exercise program and therapy along with steroid injection and Synvisc injection without relief to the knee. She has difficulty doing things such as getting outside of the home including shopping and getting up and down stairs and has unrelenting pain that will wake her up at night. She comes in to discuss knee replacement. She has a history of significant arthritis in the medial and patellofemoral compartments. She reports persisting unrelenting pain.   ALLERGIES AND ADVERSE REACTIONS: Penicillin.   PAST MEDICAL HISTORY:  1. Hypertension.  2. Chickenpox.   PHYSICAL EXAMINATION: LEFT KNEE: Range of motion is 10 to 100 degrees with crepitation and varus deformity that is passively correctable. She has medial  joint line tenderness and even palpable osteophytes. There is a Dealer cyst present. Skin about the knee is intact. Distally she has good pulses and trace edema. Sensation is intact. There is some discoloration consistent with some venous stasis, on the dorsum of the foot, but the skin is otherwise normal in the lower extremity. She does not have any pain with hip rotation.   Prior x-rays have confirmed severe medial and patellofemoral degenerative changes with extensive osteophyte formation and moderately advanced lateral compartment osteoarthritis.   HOSPITAL COURSE: The patient underwent the aforementioned procedure without complication, on 03/30/2011, and was transferred to the postanesthesia care unit and then the orthopedic floor in stable condition. She was able to start to do a straight leg raise even on postoperative day one. She was treated with TED hose and Xarelto for deep vein thrombosis prophylaxis. Hemoglobin was 9.6 on the first day postoperatively. The second day postoperative she had developed some tachycardia which was fairly consistent throughout her stay here. Her left knee dressing was changed. Her incision site was without any sign of infection. The patient's PCA and Foley catheter were discontinued. She had tolerated her diet well while here. She did pass some stool before leaving. Her hemoglobin would go down to 9.4, but no transfusion was deemed necessary. The patient would work with physical therapy on multiple occasions while here and ended up ambulating 230 feet on 04/02/2011; however, earlier on she was ambulating only under 100 feet during her stay.   CONDITION AT DISCHARGE: Stable.   DISPOSITION: Home with home health physical therapy.    DISCHARGE MEDICATIONS:  1. Xarelto 10 mg a day.  2. Ultram 1 to 2 every six  hours as needed for pain. 3. Oxycodone 5 to 10 mg every four hours as needed for pain.   DISCHARGE INSTRUCTIONS AND FOLLOW-UP: 1. The patient  [facility] 04/13/2011 at 2:45 for knee staple removal. She will also followup with Dr. Bethann PunchesMark Miller on 04/06/2011 at 1:45 concerning her tachycardia issues and chest congestion that she had.  2. She is weight-bearing as tolerated on the surgical leg.  3. She will wear thigh-high TED hose during the day and elevate her leg.  4. Regular diet without concentrated sweets or sugar.  5. She will take a multivitamin.  6. She will keep her left knee dressing on and keep it clean and dry, but may use the Polar Care unit on it. 7. She will call our office for any disturbing symptoms. 8. She will receive home therapy.  ____________________________ Letta MoynahanJonathan R. KirbyPrentice, [place]PA jrp:slb D: 04/03/2011 11:35:30 ET T: 04/04/2011 16:14:25 ET JOB#: [id number]  cc: Letta MoynahanJonathan R. Clyde CanterburyPrentice, [place]PA, <Dictator> Letta MoynahanJONATHAN R Ski Polich PA ELECTRONICALLY SIGNED 04/07/2011 8:03

## 2014-06-23 NOTE — Discharge Summary (Signed)
PATIENT NAME:  Dominique Dean, Dominique Dean MR#:  [id number] DATE OF BIRTH:  Jun 26, 1950  DATE OF ADMISSION:  08/26/2011 DATE OF DISCHARGE:  08/30/2011   ADMITTING DIAGNOSIS: Right knee osteoarthritis.   DISCHARGE DIAGNOSIS: Right knee osteoarthritis.   PROCEDURE: Right total knee replacement.  ANESTHESIA: Spinal.   SURGEON: Leitha Schuller, MD    ASSISTANT: Cranston Neighbor, PA-C   SPECIMEN: Cut ends of bone.   ESTIMATED BLOOD LOSS: 200 mL.  TOURNIQUET TIME: 69 minutes at 300 mmHg.   IMPLANTS: Medacta GMK primary implants size right 4N right standard femoral component, size 2 patellar component, a right fixed 3 tibial tray, and a size 3 12-mm standard tibial insert. All components cemented.   HISTORY: The patient is a 64 year old female with a history of degenerative joint disease on the right knee for the last several years. She has had successful right total hip replacement in 2012 and a successful left total knee replacement in 03/2011. Despite anti-inflammatory medications, narcotic medications, cortisone injections, and Synvisc injections, the patient continued to have severe right knee pain. Most of her pain is in the medial joint line. Previous injections had only given her 3 to 4 days of relief. Pain is limiting the amount of activities of daily living that she can perform. She states her walking and exercise is limited as well as her housework. The patient has had a CT and is ready for surgery on 08/26/2011 with Dr. Rosita Kea for right total knee replacement.   PHYSICAL EXAMINATION: GENERAL: Well developed, well nourished female in no apparent distress. Normal affect. Mild antalgic component on the right lower extremity. HEENT: Head normocephalic, atraumatic. Eyes pupils equal, round, and reactive to light. NECK: Symmetric. No dentures. HEART: Examination of the heart reveals regular rate and rhythm. There is no murmur. There is normal apical pulse. LUNGS: Clear to auscultation. There is no wheeze,  rhonchi, or crackles. There is normal expansion of bilateral chest walls. RIGHT LOWER EXTREMITY: Examination of the right lower extremity shows no effusion. No erythema. No warmth. No bony abnormalities noted. There is varus abnormality. The patient is tender along the medial and lateral joint line, mostly in the medial joint lines. There is small posterior joint effusion. The patient has full extension and flexion to 100 degrees. There is some crepitus with flexion and extension. The patient has negative McMurray's test. There is no retropatellar discomfort. The patient has no instability of the knee. NEUROLOGIC: There is good quad control. There is no node atrophy. The patient has a negative straight leg raise. The patient has normal sensation to light touch. VASCULAR: The patient has a less than 2 second capillary refill. Dorsalis pedis and posterior tibial pulses are intact.   HOSPITAL COURSE: After initial admission to the hospital on 08/26/2011, the patient had surgery that same day. The patient had good pain control afterwards and was brought to the orthopedic floor from the PAC-U. The next day the patient was found to have decreased dorsiflexion within the right foot. She progressed well with physical therapy and was able to increase dorsiflexion minimally over the next couple of days. The patient was found to have some hyponatremia. She was put on fluid restrictions as well as her HCTZ was discontinued. The patient BMPs were monitored daily and shown that her sodium was increasing daily. On 08/30/2011, the patient's sodium was 134. Blood pressure was stable. The patient was doing well and had progressed well with physical therapy. AFO brace was delivered and sized to patient today on  08/30/2011 and the patient was stable and ready to go home with home health on 08/30/2011.   DISCHARGE INSTRUCTIONS:  1. She may gradually increase weightbearing on the affected extremity.  2. She is to wear knee-high TED  hose on both legs and remove at bedtime. She is to replace when arising the next morning.  3. She is to keep a pillow under her right knee and wear her AFO brace when ambulating.  4. She is to resume a regular diet as tolerated.   WOUND CARE:  1. Continue using the Polar Care unit maintaining a temperature between 40 and 50 degrees.  2. Do not get the dressing or bandage wet or dirty.  3. Call [facility] office if the dressing gets water under it.  4. Leave the dressing on.   SHE IS TO CALL [facility] IF ANY OF THE FOLLOWING OCCUR:  1. Bright red bleeding from the incision or fever above 101.5 degrees, redness, swelling or drainage at the incision.  2. She is also to call [facility] is she has any increased pain, numbness, weakness, or difficulty with bowel or bladder function.   REFERRAL: She is given referral for home physical therapy and she will continue her physical therapy rehab at home. [facility] if a therapist has not contacted her within 48 hours.   FOLLOW-UP: She has [facility] in two weeks. Appointment has been scheduled.   DISCHARGE MEDICATIONS:  1. Cyclobenzaprine 10 mg oral tablet 1 tablet orally 3 times a day as needed.  2. Omeprazole 20 mg oral delayed-release capsule one cap orally once a day in the morning.  3. Benicar/HCT 40 mg per 12.5 mg oral tablet 1 tablet orally once a day in the morning.  4. Ferrous sulfate 324 mg oral delayed-release tablet 1 tablet orally once a day in the morning.  5. Verapamil 240 mg 24 hours oral capsule extended-release one cap orally once a day at bedtime.  6. Levothyroxine 137 mcg, 0.137 mg, oral tablet 1 tablet orally once a day in the morning.  7. Docusate sodium 100 mg oral capsule one cap 2 times a day. 8. Pramipexole 0.5 mg oral tablet 1 tablet orally at night.  9. Tramadol 50 mg oral tablet, 37.5/325, oral as needed. 10. Alprazolam  0.25 mg oral tab orally as needed. 11. Clotrimazole 1% topical cream apply topically to affected area as needed.  12. Multivitamin 50+ with D3 400 international units 1 tab orally once a day in the morning.   ____________________________ Evon Slackhomas C. Birdena Kingma, PA-C tcg:drc D: 08/30/2011 11:57:36 ET T: 08/30/2011 12:10:11 ET JOB#: [id number]  cc: Evon Slackhomas C. Roby Donaway, PA-C, <Dictator> Evon SlackHOMAS C Antoinne Spadaccini PA ELECTRONICALLY SIGNED 08/31/2011 16:55

## 2014-06-23 NOTE — Consult Note (Signed)
PATIENT NAME:  Dominique Dean, Dominique Dean MR#:  [id number] DATE OF BIRTH:  18-Sep-1950  DATE OF CONSULTATION:  08/28/2011  REFERRING PHYSICIAN:  Kennedy BuckerMichael Menz, MD CONSULTING PHYSICIAN:  Herschell Dimesichard J. Renae GlossWieting, MD  PRIMARY CARE PHYSICIAN: Bethann PunchesMark Miller, MD  REASON FOR CONSULTATION: Hyponatremia.   HISTORY OF PRESENT ILLNESS: This is a 64 year old female who underwent an elective right knee replacement on 08/26/2011. Her preop labs showed a sodium of 138. Postoperative sodium was 130 and today 128. The patient currently physically feels okay. She did have some drainage from her right knee wound and found to have a foot drop and did have some crying and some hyperventilation today after speaking with the physical therapist. She does have some pain in both legs, but otherwise feels okay.   PAST MEDICAL HISTORY:  1. Hypertension.  2. History of papillary thyroid cancer, status post radiation therapy.  3. Chronic low back pain. 4. Anxiety and depression.  5. Gastroesophageal reflux disease and gastritis. 6. Osteoarthritis.   PAST SURGICAL HISTORY:  1. Right total hip replacement. 2. Left total knee replacement. 3. Right total knee replacement.  4. Varicose veins.  5. Ruptured disk.  6. Tubal ligation.   ALLERGIES: Penicillin causes rash, also in the computer listed aspirin.   SOCIAL HISTORY: Quit smoking over 20 years ago. Occasional wine. No drug use. She lives with her husband. 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   FAMILY HISTORY: Both parents are living. Father with diabetes and hypertension. Mother with diabetes, heart disease, and hypertension.   HOME MEDICATIONS: (As per prescription writer) 1. Alprazolam 0.25 mg as needed for anxiety. 2. Benicar/HCT 40/12.5 mg one tablet in the morning. 3. Clotrimazole 1% topical cream to affected area as needed. 4. Flexeril 10 mg three times daily as needed. 5. Colace 100 mg twice a day.  6. Ferrous sulfate 324 mg daily.   7. Levothyroxine 137 mcg daily.  8. Multivitamin with B. 9. Omeprazole 20 mg daily.  10. Requip 0.5 mg at bedtime.  11. Tramadol p.r.n. 12. Verapamil 240 mg extended-release at bedtime.   REVIEW OF SYSTEMS: CONSTITUTIONAL: Positive for chills. No sweats. No fever. Positive for weight loss of 10 pounds. No weakness or fatigue. EYES: She does wear glasses. EARS, NOSE, MOUTH, AND THROAT: Positive for runny nose. No sore throat. No difficulty swallowing. CARDIOVASCULAR: No chest pain. No palpitations. RESPIRATORY: She did have a hyperventilating episode today. Cough in the morning with some phlegm. No hemoptysis. GASTROINTESTINAL: Positive for nausea after surgery. No vomiting. No abdominal pain. States that the food is not good and not eating as well. No diarrhea. Positive for constipation. No bright red blood per rectum. No melena. GENITOURINARY: No burning on urination or hematuria. MUSCULOSKELETAL: Pain in the surgical leg and soreness in the left thigh. NEUROLOGIC: Found to have a right foot drop. PSYCHIATRIC: Positive for anxiety and depression. ENDOCRINE: Positive for hypothyroidism. HEMATOLOGIC/LYMPHATIC: Positive for anemia.  PHYSICAL EXAMINATION:   VITAL SIGNS: Blood pressure 99/66, pulse 108, temperature 98.2, pulse oximetry 93% on room air, and respirations 18.   GENERAL: No respiratory distress.   EYES: Conjunctivae and lids normal. Pupils equal, round, and reactive to light. Extraocular muscles intact. No nystagmus.   EARS, NOSE, MOUTH, AND THROAT: Tympanic membranes no erythema. Nasal mucosa no erythema. Throat no erythema. No exudate seen. Lips and gums no lesions.   NECK: No JVD. No bruits. No lymphadenopathy. No thyromegaly. No thyroid nodules palpated.   RESPIRATORY: Lungs are clear to  auscultation. No use of accessory muscles to breathe. No rhonchi, rales, or wheeze heard.   CARDIOVASCULAR: S1 and S2 normal. No gallops, rubs, or murmurs heard. Carotid upstroke 2+ bilaterally.  No bruits.   EXTREMITIES: Dorsalis pedis pulses 1+ bilaterally. Trace edema of the lower extremities.  ABDOMEN: Soft and nontender. No organomegaly/splenomegaly. Normoactive bowel sounds. No masses felt.   LYMPHATIC: No lymph nodes in the neck.   MUSCULOSKELETAL: Trace edema. No clubbing. No cyanosis.   SKIN: No ulcers or lesions seen.   NEUROLOGIC: Cranial nerves II through XII grossly intact. Deep tendon reflexes not done with surgery. Right foot drop. Unable to flex or extend at the ankle, on the right foot.   PSYCHIATRIC: The patient is oriented to person, place, and time.   LABORATORY DATA: Glucose 102, BUN 5, creatinine 0.5, sodium 128, potassium 3.6, chloride 93, CO2 28, and calcium 8.0. Hemoglobin 10.8.   ASSESSMENT AND PLAN:  1. Hyponatremia. The patient states that she does drink an awful lot of fluids at home, four large containers of water per day. We will put on a fluid restriction of 1.5 liters. We will send off a urine sodium and urine osmolality. We will discontinue hydrochlorothiazide. Since the blood pressure right now is 99 systolic we will give a fluid bolus and hold on the fluid after that.  2. Hypothyroidism. Continue levothyroxine.  3. Hypertension. Blood pressure is borderline low. Right now we will give a fluid bolus, discontinue the hydrochlorothiazide, and put holding parameters on the Benicar and verapamil.  4. Gastroesophageal reflux disease. Continue omeprazole.  5. Right foot drop status post surgery. Orthopedics and physical therapy following. Hopefully will improve with the dressing change today.  6. Anxiety and depression. The patient is on p.r.n. Xanax. The patient did have a possible panic attack, improved with the Xanax.   TIME SPENT ON CONSULTATION: 55 minutes. ____________________________ Herschell Dimes. Renae Gloss, MD [id number] D: 08/28/2011 14:27:06 ET T: 08/28/2011 15:05:25 ET JOB#: [id number]  cc: Herschell Dimes. Renae Gloss, MD, <Dictator> Danella Penton,  MD Leitha Schuller, MD Salley Scarlet MD ELECTRONICALLY SIGNED 08/30/2011 18:11
